# Patient Record
Sex: Male | Born: 1985 | Race: Black or African American | Hispanic: No | Marital: Married | Smoking: Never smoker
Health system: Southern US, Community
[De-identification: ages and names within clinical notes are randomized; demographics above are authoritative.]

---

## 2016-09-05 DIAGNOSIS — J40 Bronchitis, not specified as acute or chronic: Secondary | ICD-10-CM | POA: Diagnosis not present

## 2017-02-07 DIAGNOSIS — Z0000 Encounter for general adult medical examination without abnormal findings: Secondary | ICD-10-CM | POA: Diagnosis not present

## 2017-02-07 DIAGNOSIS — Z1389 Encounter for screening for other disorder: Secondary | ICD-10-CM | POA: Diagnosis not present

## 2017-02-08 DIAGNOSIS — K7689 Other specified diseases of liver: Secondary | ICD-10-CM | POA: Diagnosis not present

## 2017-02-08 DIAGNOSIS — R7309 Other abnormal glucose: Secondary | ICD-10-CM | POA: Diagnosis not present

## 2018-01-30 ENCOUNTER — Ambulatory Visit: Payer: Self-pay

## 2018-01-30 ENCOUNTER — Other Ambulatory Visit: Payer: Self-pay | Admitting: Occupational Medicine

## 2018-01-30 DIAGNOSIS — Z0000 Encounter for general adult medical examination without abnormal findings: Secondary | ICD-10-CM

## 2018-03-15 DIAGNOSIS — E6609 Other obesity due to excess calories: Secondary | ICD-10-CM | POA: Diagnosis not present

## 2018-03-15 DIAGNOSIS — R7309 Other abnormal glucose: Secondary | ICD-10-CM | POA: Diagnosis not present

## 2018-03-15 DIAGNOSIS — Z0000 Encounter for general adult medical examination without abnormal findings: Secondary | ICD-10-CM | POA: Diagnosis not present

## 2018-10-11 ENCOUNTER — Other Ambulatory Visit (HOSPITAL_COMMUNITY): Payer: Self-pay | Admitting: Family Medicine

## 2018-10-11 DIAGNOSIS — S8992XD Unspecified injury of left lower leg, subsequent encounter: Secondary | ICD-10-CM

## 2018-10-15 ENCOUNTER — Other Ambulatory Visit: Payer: Self-pay | Admitting: Family Medicine

## 2018-10-15 ENCOUNTER — Other Ambulatory Visit (HOSPITAL_COMMUNITY): Payer: Self-pay | Admitting: Family Medicine

## 2018-10-15 DIAGNOSIS — M25562 Pain in left knee: Secondary | ICD-10-CM

## 2018-10-30 ENCOUNTER — Ambulatory Visit (HOSPITAL_COMMUNITY): Payer: 59

## 2018-11-16 ENCOUNTER — Encounter (HOSPITAL_COMMUNITY): Payer: Self-pay

## 2018-11-16 ENCOUNTER — Ambulatory Visit (HOSPITAL_COMMUNITY): Admission: RE | Admit: 2018-11-16 | Payer: 59 | Source: Ambulatory Visit

## 2018-11-22 ENCOUNTER — Ambulatory Visit (HOSPITAL_COMMUNITY): Payer: 59

## 2018-12-06 ENCOUNTER — Ambulatory Visit (HOSPITAL_COMMUNITY): Payer: 59

## 2018-12-20 ENCOUNTER — Ambulatory Visit (HOSPITAL_COMMUNITY): Payer: 59

## 2019-02-19 ENCOUNTER — Other Ambulatory Visit: Payer: Self-pay | Admitting: *Deleted

## 2019-02-19 DIAGNOSIS — Z20822 Contact with and (suspected) exposure to covid-19: Secondary | ICD-10-CM

## 2019-02-21 LAB — NOVEL CORONAVIRUS, NAA: SARS-CoV-2, NAA: NOT DETECTED

## 2019-07-17 ENCOUNTER — Ambulatory Visit: Payer: 59 | Admitting: Allergy & Immunology

## 2019-08-16 ENCOUNTER — Other Ambulatory Visit: Payer: Self-pay

## 2019-08-16 ENCOUNTER — Ambulatory Visit (INDEPENDENT_AMBULATORY_CARE_PROVIDER_SITE_OTHER): Payer: No Typology Code available for payment source | Admitting: Allergy & Immunology

## 2019-08-16 ENCOUNTER — Encounter: Payer: Self-pay | Admitting: Allergy & Immunology

## 2019-08-16 VITALS — BP 112/82 | HR 88 | Temp 98.6°F | Resp 18 | Ht 77.0 in | Wt 263.4 lb

## 2019-08-16 DIAGNOSIS — T7800XD Anaphylactic reaction due to unspecified food, subsequent encounter: Secondary | ICD-10-CM | POA: Diagnosis not present

## 2019-08-16 DIAGNOSIS — J3089 Other allergic rhinitis: Secondary | ICD-10-CM

## 2019-08-16 NOTE — Progress Notes (Signed)
NEW PATIENT  Date of Service/Encounter:  08/16/19  Referring provider: [facility]   Assessment:   Perennial allergic rhinitis (dust mite and cockroach)  Anaphylactic shock due to food  Plan/Recommendations:   1. Chronic rhinitis - Testing was positive only to dust mite and cockroach. - We are going to get blood work to confirm this. - Copy of testing provided. - Continue with Flonase one spray per nostril up to twice daily as needed. - Continue with Allegra or Zyrtec daily. - We will call you in 1-2 weeks with the results of the testing.   2. Anaphylactic shock due to food (shellfish) - Testing was negative to shellfish, but we are going to get some blood work to look into this more.  - We will call you in 1-2 weeks with the results of the testing. - We will hold off on EpiPen training at this time.  3. Return in about 3 months (around 11/16/2019). This can be an in-person, a virtual Webex or a telephone follow up visit.  Subjective:   Daryl Decker is a 34 y.o. male presenting today for evaluation of  Chief Complaint  Patient presents with  . Food Intolerance    Shellfish- throat itching, hives on lips, chicken  . Allergic Rhinitis   . Sinus Problem    hx sinus problem    Daryl Decker has a history of the following: Patient Active Problem List   Diagnosis Date Noted  . Perennial allergic rhinitis 08/18/2019  . Anaphylactic shock due to adverse food reaction 08/18/2019    History obtained from: chart review and patient.  Daryl Decker was referred by [facility], [facility].     Daryl Decker is a 34 y.o. male presenting for an evaluation of seafood and environmental allergies. He lives in [place] but sees the [facility] [place] for his medical needs.    Allergic Rhinitis Symptom History: He went out to mow the grass, he develops redness and headaches. This has been ongoing since he was a child. He started having bigger issues when he was 21.  He is currently taking cetirizine daily.  Food Allergy Symptom History: He hs noticed that he has throat closure and irritation with bumps around his lips/face from eating shellfish. He is unsure whether this was from the mask or something else. He started having issues in the last year or so. He has noticed some itchiness in his throat with ingestion of shellfish. Things cleared up when he was not wearing his mask. He was at the beach and ate some seafood and developed those symptoms. He never had to go to the hospital for these symptoms. He did take Benadryl and his symptoms got better.   Otherwise, there is no history of other atopic diseases, including asthma, drug allergies, stinging insect allergies, eczema, urticaria or contact dermatitis. There is no significant infectious history. Vaccinations are up to date.    Past Medical History: Patient Active Problem List   Diagnosis Date Noted  . Perennial allergic rhinitis 08/18/2019  . Anaphylactic shock due to adverse food reaction 08/18/2019    Medication List:  Allergies as of 08/16/2019   No Known Allergies     Medication List       Accurate as of Aug 16, 2019 11:59 PM. If you have any questions, ask your nurse or doctor.        cetirizine 10 MG tablet Commonly known as: ZYRTEC Take 10 mg by mouth daily as needed for allergies.  fexofenadine 180 MG tablet Commonly known as: ALLEGRA Take 180 mg by mouth daily as needed for allergies or rhinitis.   levocetirizine 5 MG tablet Commonly known as: XYZAL Take 5 mg by mouth daily as needed for allergies.       Birth History: non-contributory  Developmental History: non-contributory  Past Surgical History: History reviewed. No pertinent surgical history.   Family History: History reviewed. No pertinent family history.   Social History: Daryl Decker lives at home with his wife and four children (the latest one is not even one week old).  He lives in a house that was built in  1971.  There are hardwoods and tile throughout the home.  They have gas heating and central cooling.  There are no animals inside or outside of the home.  He does have dust mite covers on his bedding.  There is no tobacco exposure.  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  He was in the [employer] for 6 years.   Review of Systems  Constitutional: Negative.  Negative for chills, fever, malaise/fatigue and weight loss.  HENT: Positive for congestion and sinus pain. Negative for ear discharge and ear pain.        Positive for postnasal drip. Positive for throat clearing.  Eyes: Negative for pain, discharge and redness.  Respiratory: Negative for cough, sputum production, shortness of breath and wheezing.   Cardiovascular: Negative.  Negative for chest pain and palpitations.  Gastrointestinal: Negative for abdominal pain, constipation, diarrhea, heartburn, nausea and vomiting.  Skin: Negative.  Negative for itching and rash.  Neurological: Negative for dizziness and headaches.  Endo/Heme/Allergies: Positive for environmental allergies. Does not bruise/bleed easily.       Objective:   Blood pressure 112/82, pulse 88, temperature 98.6 F (37 C), temperature source Temporal, resp. rate 18, height 6\' 5"  (1.956 m), weight 263 lb 6.4 oz (119.5 kg), SpO2 96 %. Body mass index is 31.23 kg/m.   Physical Exam:   Physical Exam  Constitutional: He appears well-developed.  Fit male. Friendly.   HENT:  Head: Normocephalic and atraumatic.  Right Ear: Tympanic membrane, external ear and ear canal normal. No drainage, swelling or tenderness. Tympanic membrane is not injected, not scarred, not erythematous, not retracted and not bulging.  Left Ear: Tympanic membrane, external ear and ear canal normal. No drainage, swelling or tenderness. Tympanic membrane is not injected, not scarred, not erythematous, not retracted and not bulging.  Nose: Mucosal edema and rhinorrhea  present. No nasal deformity or septal deviation. No epistaxis. Right sinus exhibits no maxillary sinus tenderness and no frontal sinus tenderness. Left sinus exhibits no maxillary sinus tenderness and no frontal sinus tenderness.  Mouth/Throat: Uvula is midline and oropharynx is clear and moist. Mucous membranes are not pale and not dry.  Turbinates enlarged and pale.   Eyes: Pupils are equal, round, and reactive to light. Conjunctivae and EOM are normal. Right eye exhibits no chemosis and no discharge. Left eye exhibits no chemosis and no discharge. Right conjunctiva is not injected. Left conjunctiva is not injected.  Cardiovascular: Normal rate, regular rhythm and normal heart sounds.  Respiratory: Effort normal and breath sounds normal. No accessory muscle usage. No tachypnea. No respiratory distress. He has no wheezes. He has no rhonchi. He has no rales. He exhibits no tenderness.  No increased work of breathing noted.   GI: There is no abdominal tenderness. There is no rebound and no guarding.  Lymphadenopathy:  Head (right side): No submandibular, no tonsillar and no occipital adenopathy present.       Head (left side): No submandibular, no tonsillar and no occipital adenopathy present.    He has no cervical adenopathy.  Neurological: He is alert.  Skin: No abrasion, no petechiae and no rash noted. Rash is not papular, not vesicular and not urticarial. No erythema. No pallor.  No eczematous or urticarial lesions noted.   Psychiatric: He has a normal mood and affect.     Diagnostic studies:    Allergy Studies:    Airborne Adult Perc - 08/16/19 1500    Time Antigen Placed  0330    Allergen Manufacturer  Waynette Buttery    Location  Back    Number of Test  59    1. Control-Buffer 50% Glycerol  Negative    2. Control-Histamine 1 mg/ml  2+    3. Albumin saline  Negative    4. Bahia  Negative    5. French Southern Territ[place]  Negative    6. Johnson  Negative    7. Kentucky Blue  Negative    8. Meadow  Fescue  Negative    9. Perennial Rye  Negative    10. Sweet Vernal  Negative    11. Timothy  Negative    12. Cocklebur  Negative    13. Burweed Marshelder  Negative    14. Ragweed, short  Negative    15. Ragweed, Giant  Negative    16. Plantain,  English  Negative    17. Lamb's Quarters  Negative    18. Sheep Sorrell  Negative    19. Rough Pigweed  Negative    20. Marsh Elder, Rough  Negative    21. Mugwort, Common  Negative    22. Ash mix  Negative    23. Birch mix  Negative    24. Beech American  Negative    25. Box, Elder  Negative    26. Cedar, red  Negative    27. Cottonwood, [place]  Negative    28. Elm mix  Negative    29. Hickory  Negative    30. Maple mix  Negative    31. Oak, [place] mix  Negative    32. Pecan Pollen  Negative    33. Pine mix  Negative    34. Sycamore Eastern  Negative    35. Walnut, Black Pollen  Negative    36. Alternaria alternata  Negative    37. Cladosporium Herbarum  Negative    38. Aspergillus mix  Negative    39. Penicillium mix  Negative    40. Bipolaris sorokiniana (Helminthosporium)  Negative    41. Drechslera spicifera (Curvularia)  Negative    42. Mucor plumbeus  Negative    43. Fusarium moniliforme  Negative    44. Aureobasidium pullulans (pullulara)  Negative    45. Rhizopus oryzae  Negative    46. Botrytis cinera  Negative    47. Epicoccum nigrum  Negative    48. Phoma betae  Negative    49. Candida Albicans  Negative    50. Trichophyton mentagrophytes  Negative    51. Mite, D Farinae  5,000 AU/ml  2+    52. Mite, D Pteronyssinus  5,000 AU/ml  3+    53. Cat Hair 10,000 BAU/ml  Negative    54.  Dog Epithelia  Negative    55. Mixed Feathers  Negative    56. Horse Epithelia  Negative    57. Cockroach,  German  3+    58. Mouse  Negative    59. Tobacco Leaf  Negative     Food Adult Perc - 08/16/19 1500    Time Antigen Placed  0330    Allergen Manufacturer  Lavella Hammock    Location  Back    Number of allergen test  12    18.  Catfish  Negative    19. Bass  Negative    20. Trout  Negative    21. Tuna  Negative    22. Salmon  Negative    23. Flounder  Negative    24. Codfish  Negative    25. Shrimp  Negative    26. Crab  Negative    27. Lobster  Negative    28. Oyster  Negative    29. Scallops  Negative       Allergy testing results were read and interpreted by myself, documented by clinical staff.         Salvatore Marvel, MD [facility]

## 2019-08-16 NOTE — Patient Instructions (Addendum)
1. Chronic rhinitis - Testing was positive only to dust mite and cockroach. - We are going to get blood work to confirm this. - Copy of testing provided. - Continue with Flonase one spray per nostril up to twice daily as needed. - Continue with Allegra or Zyrtec daily. - We will call you in 1-2 weeks with the results of the testing.   2. Anaphylactic shock due to food (shellfish) - Testing was negative to shellfish, but we are going to get some blood work to look into this more.  - We will call you in 1-2 weeks with the results of the testing. - We will hold off on EpiPen training at this time.  3. Return in about 3 months (around 11/16/2019). This can be an in-person, a virtual Webex or a telephone follow up visit.   Please inform us of any Emergency Department visits, hospitalizations, or changes in symptoms. Call us before going to the ED for breathing or allergy symptoms since we might be able to fit you in for a sick visit. Feel free to contact us anytime with any questions, problems, or concerns.  It was a pleasure to meet you today! Congrats on the newest kiddo!   Websites that have reliable patient information: 1. American Academy of Asthma, Allergy, and Immunology: www.aaaai.org 2. Food Allergy Research and Education (FARE): foodallergy.org 3. Mothers of Asthmatics: http://www.asthmacommunitynetwork.org 4. American College of Allergy, Asthma, and Immunology: www.acaai.org   COVID-19 Vaccine Information can be found at: [email or web address].nl For questions related to vaccine distribution or appointments, please email [email or web address] or call [phone].     "Like" [place] on Facebook and Instagram for our latest updates!       HAPPY SPRING!  Make sure you are registered to vote! If you have moved or changed any of your contact information, you will need to get this updated before voting!  In some cases, you MAY  be able to register to vote online: [email or web address]    Control of Dust Mite Allergen    Dust mites play a major role in allergic asthma and rhinitis.  They occur in environments with high humidity wherever human skin is found.  Dust mites absorb humidity from the atmosphere (ie, they do not drink) and feed on organic matter (including shed human and animal skin).  Dust mites are a microscopic type of insect that you cannot see with the naked eye.  High levels of dust mites have been detected from mattresses, pillows, carpets, upholstered furniture, bed covers, clothes, soft toys and any woven material.  The principal allergen of the dust mite is found in its feces.  A gram of dust may contain 1,000 mites and 250,000 fecal particles.  Mite antigen is easily measured in the air during house cleaning activities.  Dust mites do not bite and do not cause harm to humans, other than by triggering allergies/asthma.    Ways to decrease your exposure to dust mites in your home:  1. Encase mattresses, box springs and pillows with a mite-impermeable barrier or cover   2. Wash sheets, blankets and drapes weekly in hot water (130 F) with detergent and dry them in a dryer on the hot setting.  3. Have the room cleaned frequently with a vacuum cleaner and a damp dust-mop.  For carpeting or rugs, vacuuming with a vacuum cleaner equipped with a high-efficiency particulate air (HEPA) filter.  The dust mite allergic individual should not be in a room which is being cleaned and should wait 1  hour after cleaning before going into the room. 4. Do not sleep on upholstered furniture (eg, couches).   5. If possible removing carpeting, upholstered furniture and drapery from the home is ideal.  Horizontal blinds should be eliminated in the rooms where the person spends the most time (bedroom, study, television room).  Washable vinyl, roller-type shades are optimal. 6. Remove all non-washable  stuffed toys from the bedroom.  Wash stuffed toys weekly like sheets and blankets above.   7. Reduce indoor humidity to less than 50%.  Inexpensive humidity monitors can be purchased at most hardware stores.  Do not use a humidifier as can make the problem worse and are not recommended.    Control of Cockroach Allergen  Cockroach allergen has been identified as an important cause of acute attacks of asthma, especially in urban settings.  There are fifty-five species of cockroach that exist in the [place], however only three, the [place], [place] species produce allergen that can affect patients with Asthma.  Allergens can be obtained from fecal particles, egg casings and secretions from cockroaches.    1. Remove food sources. 2. Reduce access to water. 3. Seal access and entry points. 4. Spray runways with 0.5-1% Diazinon or Chlorpyrifos 5. Blow boric acid power under stoves and refrigerator. 6. Place bait stations (hydramethylnon) at feeding sites.

## 2019-08-18 ENCOUNTER — Encounter: Payer: Self-pay | Admitting: Allergy & Immunology

## 2019-08-18 DIAGNOSIS — J3089 Other allergic rhinitis: Secondary | ICD-10-CM | POA: Insufficient documentation

## 2019-08-18 DIAGNOSIS — T7800XA Anaphylactic reaction due to unspecified food, initial encounter: Secondary | ICD-10-CM | POA: Insufficient documentation

## 2019-11-13 ENCOUNTER — Encounter: Payer: Self-pay | Admitting: Allergy & Immunology

## 2019-11-13 ENCOUNTER — Other Ambulatory Visit: Payer: Self-pay

## 2019-11-13 ENCOUNTER — Ambulatory Visit (INDEPENDENT_AMBULATORY_CARE_PROVIDER_SITE_OTHER): Payer: No Typology Code available for payment source | Admitting: Allergy & Immunology

## 2019-11-13 VITALS — BP 108/70 | HR 86 | Temp 98.9°F | Resp 18

## 2019-11-13 DIAGNOSIS — T7800XD Anaphylactic reaction due to unspecified food, subsequent encounter: Secondary | ICD-10-CM

## 2019-11-13 DIAGNOSIS — J3089 Other allergic rhinitis: Secondary | ICD-10-CM

## 2019-11-13 NOTE — Progress Notes (Signed)
FOLLOW UP  Date of Service/Encounter:  11/14/19   Assessment:   Perennial allergic rhinitis (dust mite and cockroach)  Anaphylactic shock due to food - needs blood work for confirmation  Plan/Recommendations:   1. Chronic rhinitis (dust mites, cockroach) - Continue with Flonase one spray per nostril up to twice daily as needed. - Continue with Allegra or Zyrtec daily.  2. Anaphylactic shock due to food (shellfish) - Blood work pending.  3. Return in about 1 year (around 11/12/2020). This can be an in-person, a virtual Webex or a telephone follow up visit.   Subjective:   Daryl Decker is a 34 y.o. male presenting today for follow up of  Chief Complaint  Patient presents with  . Allergic Rhinitis     has changed up his lifestyle some after finding out his allergies. has started using a respirator mask, wearing long sleeves, etc. has helped tremendously. headaches are much less frequent.     Daryl Decker has a history of the following: Patient Active Problem List   Diagnosis Date Noted  . Perennial allergic rhinitis 08/18/2019  . Anaphylactic shock due to adverse food reaction 08/18/2019    History obtained from: chart review and patient.  Daryl Decker is a 34 y.o. male presenting for a follow up visit.  He was last seen in May 2021.  At that time, he had testing that was positive only to dust mite and cockroach.  We continued the Flonase and Allegra.  We also got blood work to confirm this.  He did have testing that was negative to all of her shellfish.  We got some blood work to confirm this, but does not seem that this was ever drawn.  We held off on an EpiPen.  Since last visit, he reports he has done very well.  By knowing what his allergic triggers are, he can avoid them a lot better.  He remains on the Flonase and Allegra.  He has not gotten his blood work yet.  He has not needed any antibiotics since last visit.  He continues to avoid shellfish.  He completely forgot  about the blood work pending so he is planning to go to get it today after the visit.  He has not had any accidental exposures.  He did have a vasectomy 3 months ago and is reporting some residual pain from the procedure.  He is using only ibuprofen for that.  He is going to talk to his urologist today.  Otherwise, there have been no changes to his past medical history, surgical history, family history, or social history.    Review of Systems  Constitutional: Negative.  Negative for chills, fever, malaise/fatigue and weight loss.  HENT: Positive for congestion. Negative for ear discharge, ear pain and sinus pain.   Eyes: Negative for pain, discharge and redness.  Respiratory: Negative for cough, sputum production, shortness of breath and wheezing.   Cardiovascular: Negative.  Negative for chest pain and palpitations.  Gastrointestinal: Negative for abdominal pain, constipation, diarrhea, heartburn, nausea and vomiting.  Skin: Negative.  Negative for itching and rash.  Neurological: Negative for dizziness and headaches.  Endo/Heme/Allergies: Positive for environmental allergies. Does not bruise/bleed easily.       Objective:   Blood pressure 108/70, pulse 86, temperature 98.9 F (37.2 C), temperature source Temporal, resp. rate 18, SpO2 98 %. There is no height or weight on file to calculate BMI.   Physical Exam:  Physical Exam Constitutional:      Appearance: He  is well-developed.     Comments: Pleasant interactive male.  HENT:     Head: Normocephalic and atraumatic.     Right Ear: Tympanic membrane, ear canal and external ear normal.     Left Ear: Tympanic membrane, ear canal and external ear normal.     Nose: No nasal deformity, septal deviation, mucosal edema or rhinorrhea.     Right Turbinates: Enlarged and swollen.     Left Turbinates: Enlarged and swollen.     Right Sinus: No maxillary sinus tenderness or frontal sinus tenderness.     Left Sinus: No maxillary sinus  tenderness or frontal sinus tenderness.     Mouth/Throat:     Mouth: Mucous membranes are not pale and not dry.     Pharynx: Uvula midline.  Eyes:     General:        Right eye: No discharge.        Left eye: No discharge.     Conjunctiva/sclera: Conjunctivae normal.     Right eye: Right conjunctiva is not injected. No chemosis.    Left eye: Left conjunctiva is not injected. No chemosis.    Pupils: Pupils are equal, round, and reactive to light.  Cardiovascular:     Rate and Rhythm: Normal rate and regular rhythm.     Heart sounds: Normal heart sounds.  Pulmonary:     Effort: Pulmonary effort is normal. No tachypnea, accessory muscle usage or respiratory distress.     Breath sounds: Normal breath sounds. No wheezing, rhonchi or rales.     Comments: Moving air well in all lung fields.  No increased work of breathing. Chest:     Chest wall: No tenderness.  Lymphadenopathy:     Cervical: No cervical adenopathy.  Skin:    Coloration: Skin is not pale.     Findings: No abrasion, erythema, petechiae or rash. Rash is not papular, urticarial or vesicular.     Comments: No eczematous or urticarial lesions noted.  Neurological:     Mental Status: He is alert.  Psychiatric:        Behavior: Behavior is cooperative.      Diagnostic studies: none      Malachi Bonds, MD  [facility]

## 2019-11-13 NOTE — Patient Instructions (Addendum)
1. Chronic rhinitis (dust mites, cockroach) - Continue with Flonase one spray per nostril up to twice daily as needed. - Continue with Allegra or Zyrtec daily.  2. Anaphylactic shock due to food (shellfish) - Blood work pending.  3. Return in about 1 year (around 11/12/2020). This can be an in-person, a virtual Webex or a telephone follow up visit.   Please inform us of any Emergency Department visits, hospitalizations, or changes in symptoms. Call us before going to the ED for breathing or allergy symptoms since we might be able to fit you in for a sick visit. Feel free to contact us anytime with any questions, problems, or concerns.  It was a pleasure to see you again today!  Websites that have reliable patient information: 1. American Academy of Asthma, Allergy, and Immunology: www.aaaai.org 2. Food Allergy Research and Education (FARE): foodallergy.org 3. Mothers of Asthmatics: http://www.asthmacommunitynetwork.org 4. American College of Allergy, Asthma, and Immunology: www.acaai.org   COVID-19 Vaccine Information can be found at: PodExchange.nl For questions related to vaccine distribution or appointments, please email [email or web address] or call [phone].     "Like" [place] on Facebook and Instagram for our latest updates!        Make sure you are registered to vote! If you have moved or changed any of your contact information, you will need to get this updated before voting!  In some cases, you MAY be able to register to vote online: [email or web address]

## 2019-11-14 ENCOUNTER — Encounter: Payer: Self-pay | Admitting: Allergy & Immunology

## 2019-11-19 ENCOUNTER — Other Ambulatory Visit: Payer: Self-pay

## 2019-11-19 ENCOUNTER — Other Ambulatory Visit: Payer: Self-pay | Admitting: *Deleted

## 2019-11-19 ENCOUNTER — Other Ambulatory Visit: Payer: 59

## 2019-11-19 DIAGNOSIS — Z20822 Contact with and (suspected) exposure to covid-19: Secondary | ICD-10-CM

## 2019-11-20 LAB — NOVEL CORONAVIRUS, NAA: SARS-CoV-2, NAA: NOT DETECTED

## 2020-03-21 HISTORY — PX: OTHER SURGICAL HISTORY: SHX169

## 2020-08-07 ENCOUNTER — Telehealth: Payer: Self-pay | Admitting: Allergy & Immunology

## 2020-08-07 NOTE — Telephone Encounter (Signed)
New request for authorization faxed to [facility] [phone].

## 2020-08-10 NOTE — Patient Instructions (Addendum)
1. Chronic rhinitis (dust mites, cockroach) - Continue with Flonase one spray per nostril up to twice daily as needed. - Continue with Allegra or Zyrtec daily as needed for runny nose or itching.  2. Anaphylactic shock due to food (shellfish) - Avoid fish and shellfish. In case of an allergic reaction, give Benadryl 4 teaspoonfuls every 4 hours, and if life-threatening symptoms occur, inject with EpiPen 0.3 mg. Demonstration given along with emergency action plan We will get some lab work to follow up on these allergies. We will call you with results once they are back  3.  Allergic conjunctivitis Start Pataday 0.2 (olopatadine) 1 drop each eye once a day as needed for itchy watery eyes  Please let us know if this treatment plan is not working well for you Schedule a follow up appointment in 1 year or sooner if needed

## 2020-08-12 ENCOUNTER — Ambulatory Visit (INDEPENDENT_AMBULATORY_CARE_PROVIDER_SITE_OTHER): Payer: No Typology Code available for payment source | Admitting: Family

## 2020-08-12 ENCOUNTER — Other Ambulatory Visit: Payer: Self-pay

## 2020-08-12 ENCOUNTER — Encounter: Payer: Self-pay | Admitting: Family

## 2020-08-12 VITALS — BP 118/70 | HR 78 | Temp 98.3°F | Resp 18 | Ht 77.0 in | Wt 253.8 lb

## 2020-08-12 DIAGNOSIS — H1013 Acute atopic conjunctivitis, bilateral: Secondary | ICD-10-CM

## 2020-08-12 DIAGNOSIS — J3089 Other allergic rhinitis: Secondary | ICD-10-CM

## 2020-08-12 DIAGNOSIS — T7800XD Anaphylactic reaction due to unspecified food, subsequent encounter: Secondary | ICD-10-CM

## 2020-08-12 MED ORDER — OLOPATADINE HCL 0.2 % OP SOLN: OPHTHALMIC | 3 refills | Status: AC

## 2020-08-12 MED ORDER — EPINEPHRINE 0.3 MG/0.3ML IJ SOAJ
0.3000 mg | INTRAMUSCULAR | 1 refills | Status: DC | PRN
Start: 2020-08-12 — End: 2021-11-19

## 2020-08-12 NOTE — Progress Notes (Signed)
[address] [place] [address] Dept: [phone]  FOLLOW UP NOTE  Patient ID: Daryl Decker, male    DOB: 05-Feb-1986  Age: 35 y.o. MRN: [id number] Date of Office Visit: 08/12/2020  Assessment  Chief Complaint: perennial allergic rhinitis  HPI Daryl Decker is a 35 year old male who presents today for follow-up of chronic rhinitis and anaphylactic shock due to food.  He was last seen on November 14, 2019 by Dr. Dellis Anes.  Allergic rhinitis is reported as controlled with Flonase nasal spray as needed and Zyrtec as needed.  He reports since getting hypoallergenic sheets and wearing a mask at work that his symptoms have decreased and his headaches have cut down now.  He now will maybe have a headache once every 2 months and these are more migraine headaches.  He denies any rhinorrhea, nasal congestion, and postnasal drip.  He has not had any sinus infections since we last saw him.  He reports that at times he will try a little bit of shellfish and fish and will begin to have an itchy/ irritated throat and if he eats too much of these items his stomach will hurt and he will feel out of it.  On May 8 of this year he was in [facility] and had shrimp and crab legs and had these same symptoms.  His wife suggested that he take Benadryl and within 30 minutes he started feeling better.  He reports that he does not have an EpiPen.  He is interested in getting lab work that he did not get done from the last office visit.   Drug Allergies:  No Known Allergies  Review of Systems: Review of Systems  Constitutional: Negative for chills and fever.  HENT:       Denies rhinorrhea, nasal congestion, and postnasal drip  Eyes:       Reports occasional itchy watery eyes  Respiratory: Negative for cough, shortness of breath and wheezing.   Cardiovascular: Negative for chest pain and palpitations.  Gastrointestinal: Negative for heartburn.  Genitourinary: Negative for dysuria.  Skin:  Positive for itching.       He reports that he recently had poison ivy on his arms and the back of his neck  Neurological: Positive for headaches.       Reports occasional migraines  Endo/Heme/Allergies: Positive for environmental allergies.    Physical Exam: BP 118/70 (BP Location: Right Arm, Patient Position: Sitting, Cuff Size: Large)   Pulse 78   Temp 98.3 F (36.8 C) (Temporal)   Resp 18   Ht 6\' 5"  (1.956 m)   Wt 253 lb 12.8 oz (115.1 kg)   SpO2 97%   BMI 30.10 kg/m    Physical Exam Constitutional:      Appearance: Normal appearance.  HENT:     Head: Normocephalic and atraumatic.     Comments: Pharynx normal, eyes normal, ears normal, nose normal    Right Ear: Tympanic membrane, ear canal and external ear normal.     Left Ear: Tympanic membrane, ear canal and external ear normal.     Nose: Nose normal.     Mouth/Throat:     Mouth: Mucous membranes are moist.     Pharynx: Oropharynx is clear.  Eyes:     Conjunctiva/sclera: Conjunctivae normal.  Cardiovascular:     Rate and Rhythm: Regular rhythm.     Heart sounds: Normal heart sounds.  Pulmonary:     Effort: Pulmonary effort is normal.     Breath sounds:  Normal breath sounds.     Comments: Lungs clear to auscultation Musculoskeletal:     Cervical back: Neck supple.  Skin:    General: Skin is warm.     Comments: No rash or urticarial lesions noted  Neurological:     Mental Status: He is alert and oriented to person, place, and time.  Psychiatric:        Mood and Affect: Mood normal.        Behavior: Behavior normal.        Thought Content: Thought content normal.        Judgment: Judgment normal.     Diagnostics: None  Assessment and Plan: 1. Anaphylactic shock due to food, subsequent encounter   2. Perennial allergic rhinitis   3. Allergic conjunctivitis of both eyes     Meds ordered this encounter  Medications  . EPINEPHrine 0.3 mg/0.3 mL IJ SOAJ injection    Sig: Inject 0.3 mg into the muscle  as needed for anaphylaxis.    Dispense:  2 each    Refill:  1  . Olopatadine HCl 0.2 % SOLN    Sig: Place 1 drop in each eye once a day as needed for itchy watery eyes    Dispense:  2.5 mL    Refill:  3    Patient Instructions  1. Chronic rhinitis (dust mites, cockroach) - Continue with Flonase one spray per nostril up to twice daily as needed. - Continue with Allegra or Zyrtec daily as needed for runny nose or itching.  2. Anaphylactic shock due to food (shellfish) - Avoid fish and shellfish. In case of an allergic reaction, give Benadryl 4 teaspoonfuls every 4 hours, and if life-threatening symptoms occur, inject with EpiPen 0.3 mg. Demonstration given along with emergency action plan We will get some lab work to follow up on these allergies. We will call you with results once they are back  3.  Allergic conjunctivitis Start Pataday 0.2 (olopatadine) 1 drop each eye once a day as needed for itchy watery eyes  Please let us know if this treatment plan is not working well for you Schedule a follow up appointment in 1 year or sooner if needed             Return in about 1 year (around 08/12/2021), or if symptoms worsen or fail to improve.    Thank you for the opportunity to care for this patient.  Please do not hesitate to contact me with questions.  Nehemiah Settle, FNP [facility]

## 2020-10-02 NOTE — Telephone Encounter (Signed)
Refaxed request for service to [facility].

## 2021-06-17 ENCOUNTER — Ambulatory Visit: Payer: Self-pay

## 2021-06-17 ENCOUNTER — Other Ambulatory Visit: Payer: Self-pay | Admitting: Family Medicine

## 2021-06-17 DIAGNOSIS — Z0000 Encounter for general adult medical examination without abnormal findings: Secondary | ICD-10-CM

## 2021-11-05 ENCOUNTER — Ambulatory Visit: Payer: No Typology Code available for payment source | Admitting: Allergy & Immunology

## 2021-11-19 ENCOUNTER — Ambulatory Visit (INDEPENDENT_AMBULATORY_CARE_PROVIDER_SITE_OTHER): Payer: No Typology Code available for payment source | Admitting: Family Medicine

## 2021-11-19 ENCOUNTER — Encounter: Payer: Self-pay | Admitting: Family Medicine

## 2021-11-19 VITALS — BP 110/82 | HR 90 | Temp 98.9°F | Resp 18 | Ht 77.0 in | Wt 267.4 lb

## 2021-11-19 DIAGNOSIS — H1013 Acute atopic conjunctivitis, bilateral: Secondary | ICD-10-CM

## 2021-11-19 DIAGNOSIS — J3089 Other allergic rhinitis: Secondary | ICD-10-CM | POA: Diagnosis not present

## 2021-11-19 DIAGNOSIS — T7800XD Anaphylactic reaction due to unspecified food, subsequent encounter: Secondary | ICD-10-CM

## 2021-11-19 DIAGNOSIS — T7800XA Anaphylactic reaction due to unspecified food, initial encounter: Secondary | ICD-10-CM

## 2021-11-19 MED ORDER — EPINEPHRINE 0.3 MG/0.3ML IJ SOAJ: 0.3000 mg | INTRAMUSCULAR | 1 refills | Status: AC | PRN

## 2021-11-19 NOTE — Progress Notes (Signed)
[address] [place] [address] Dept: [phone]  FOLLOW UP NOTE  Patient ID: Daryl Decker, male    DOB: 1986/02/26  Age: 36 y.o. MRN: [id number] Date of Office Visit: 11/19/2021  Assessment  Chief Complaint: Allergy Testing (Seafood)  HPI Daryl Decker is a 36 year old male who presents to the clinic for follow-up visit.  He was last seen in this clinic on 08/12/2021 by Nehemiah Settle, FNP, for evaluation of allergic rhinitis and food allergy to fish and shellfish.  Allergic rhinitis is reported as moderately well controlled with symptoms including sneezing especially when seasons change.  He continues Claritin as needed and Flonase as needed.  He is not currently using a nasal saline rinse.  He does report headache occurring about once a week.  He reports these headaches as a combination of sinus and migraine headache.  He continues taking sumatriptan as needed and Tylenol as needed with relief of symptoms.  He has not had any antihistamines over the last several days in anticipation of allergy skin testing.  His last environmental allergy skin testing was on 08/06/2019 and was positive to dust mite and cockroach.  Allergic conjunctivitis is reported as moderately well controlled with occasional allergy eyedrops with relief of symptoms.  He reports that he ate shellfish at a seafood restaurant about 1 year ago after which he experienced throat itching and a knot in his stomach.  He does report that he occasionally eats shrimp after which he experiences throat itching.  He reports taking Benadryl with relief of symptoms.  He denies throat swelling, hives, or abdominal symptoms.  His last food allergy skin testing was on 08/06/2019 and was negative to fish and shellfish panels.  Lab work was ordered at that time, however, has not been collected.  His current medications are listed in the chart.  Drug Allergies:  No Known Allergies  Physical Exam: BP 110/82   Pulse 90   Temp  98.9 F (37.2 C)   Resp 18   Ht 6\' 5"  (1.956 m)   Wt 267 lb 6 oz (121.3 kg)   SpO2 96%   BMI 31.71 kg/m    Physical Exam Vitals reviewed.  Constitutional:      Appearance: Normal appearance.  HENT:     Head: Normocephalic and atraumatic.     Right Ear: Tympanic membrane normal.     Left Ear: Tympanic membrane normal.     Nose:     Comments: Bilateral nares edematous and pale with clear nasal drainage noted.  Pharynx normal.  Ears normal.  Eyes normal.    Mouth/Throat:     Pharynx: Oropharynx is clear.  Eyes:     Conjunctiva/sclera: Conjunctivae normal.  Cardiovascular:     Rate and Rhythm: Normal rate and regular rhythm.     Heart sounds: Normal heart sounds. No murmur heard. Pulmonary:     Effort: Pulmonary effort is normal.     Breath sounds: Normal breath sounds.     Comments: Lungs clear to auscultation Musculoskeletal:        General: Normal range of motion.     Cervical back: Normal range of motion and neck supple.  Skin:    General: Skin is warm and dry.  Neurological:     Mental Status: He is alert and oriented to person, place, and time.  Psychiatric:        Mood and Affect: Mood normal.        Behavior: Behavior normal.  Thought Content: Thought content normal.        Judgment: Judgment normal.     Diagnostics: Percutaneous selected foods were negative to shellfish mix and shellfish panel with adequate controls.   Assessment and Plan: 1. Anaphylactic shock due to food, subsequent encounter   2. Perennial allergic rhinitis   3. Allergic conjunctivitis of both eyes     Meds ordered this encounter  Medications   EPINEPHrine 0.3 mg/0.3 mL IJ SOAJ injection    Sig: Inject 0.3 mg into the muscle as needed for anaphylaxis.    Dispense:  2 each    Refill:  1    Patient Instructions  Allergic rhinitis Continue allergen avoidance measures directed toward dust mite and cockroach as listed below Continue an over-the-counter antihistamine once a day  as needed for runny nose or itch Continue Flonase 2 sprays in each nostril once a day as needed for stuffy nose Consider saline nasal rinses as needed for nasal symptoms. Use this before any medicated nasal sprays for best result Consider allergen immunotherapy if your symptoms are not well controlled with the treatment plan as listed above  Food allergy Your skin testing was negative to shellfish at today's visit. Continue to avoid shellfish.  In case of an allergic reaction, take Benadryl 50 mg every 4 hours, and if life-threatening symptoms occur, inject with EpiPen 0.3 mg. A lab order has been placed to help [place] evaluate your food allergies. We will call you when the results become available. If the blood tests are negative you can move forward with a food challenge in the clinic.   Call the clinic if this treatment plan is not working well for you  Follow up in 1 year or sooner if needed.   Return in about 1 year (around 11/20/2022), or if symptoms worsen or fail to improve.    Thank you for the opportunity to care for this patient.  Please do not hesitate to contact me with questions.  Thermon Leyland, FNP [facility]

## 2021-11-19 NOTE — Patient Instructions (Signed)
Allergic rhinitis Continue allergen avoidance measures directed toward dust mite and cockroach as listed below Continue an over-the-counter antihistamine once a day as needed for runny nose or itch Continue Flonase 2 sprays in each nostril once a day as needed for stuffy nose Consider saline nasal rinses as needed for nasal symptoms. Use this before any medicated nasal sprays for best result Consider allergen immunotherapy if your symptoms are not well controlled with the treatment plan as listed above  Food allergy Your skin testing was negative to shellfish at today's visit. Continue to avoid shellfish.  In case of an allergic reaction, take Benadryl 50 mg every 4 hours, and if life-threatening symptoms occur, inject with EpiPen 0.3 mg. A lab order has been placed to help [place] evaluate your food allergies. We will call you when the results become available. If the blood tests are negative you can move forward with a food challenge in the clinic.   Call the clinic if this treatment plan is not working well for you  Follow up in 1 year or sooner if needed.   Control of Dust Mite Allergen Dust mites play a major role in allergic asthma and rhinitis. They occur in environments with high humidity wherever human skin is found. Dust mites absorb humidity from the atmosphere (ie, they do not drink) and feed on organic matter (including shed human and animal skin). Dust mites are a microscopic type of insect that you cannot see with the naked eye. High levels of dust mites have been detected from mattresses, pillows, carpets, upholstered furniture, bed covers, clothes, soft toys and any woven material. The principal allergen of the dust mite is found in its feces. A gram of dust may contain 1,000 mites and 250,000 fecal particles. Mite antigen is easily measured in the air during house cleaning activities. Dust mites do not bite and do not cause harm to humans, other than by triggering  allergies/asthma.  Ways to decrease your exposure to dust mites in your home:  1. Encase mattresses, box springs and pillows with a mite-impermeable barrier or cover  2. Wash sheets, blankets and drapes weekly in hot water (130 F) with detergent and dry them in a dryer on the hot setting.  3. Have the room cleaned frequently with a vacuum cleaner and a damp dust-mop. For carpeting or rugs, vacuuming with a vacuum cleaner equipped with a high-efficiency particulate air (HEPA) filter. The dust mite allergic individual should not be in a room which is being cleaned and should wait 1 hour after cleaning before going into the room.  4. Do not sleep on upholstered furniture (eg, couches).  5. If possible removing carpeting, upholstered furniture and drapery from the home is ideal. Horizontal blinds should be eliminated in the rooms where the person spends the most time (bedroom, study, television room). Washable vinyl, roller-type shades are optimal.  6. Remove all non-washable stuffed toys from the bedroom. Wash stuffed toys weekly like sheets and blankets above.  7. Reduce indoor humidity to less than 50%. Inexpensive humidity monitors can be purchased at most hardware stores. Do not use a humidifier as can make the problem worse and are not recommended.  Control of Cockroach Allergen Cockroach allergen has been identified as an important cause of acute attacks of asthma, especially in urban settings.  There are fifty-five species of cockroach that exist in the [place], however only three, the [place], [place] species produce allergen that can affect patients with Asthma.  Allergens can  be obtained from fecal particles, egg casings and secretions from cockroaches.    Remove food sources. Reduce access to water. Seal access and entry points. Spray runways with 0.5-1% Diazinon or Chlorpyrifos Blow boric acid power under stoves and refrigerator. Place bait stations  (hydramethylnon) at feeding sites.

## 2022-06-20 IMAGING — DX DG CHEST 1V
2 series · 2 of 2 positions shown · non-contrast
Comparison: 01/30/2018

CLINICAL DATA: Physical exam.

EXAM:
CHEST  1 VIEW

[chest pa (1 of 2)]
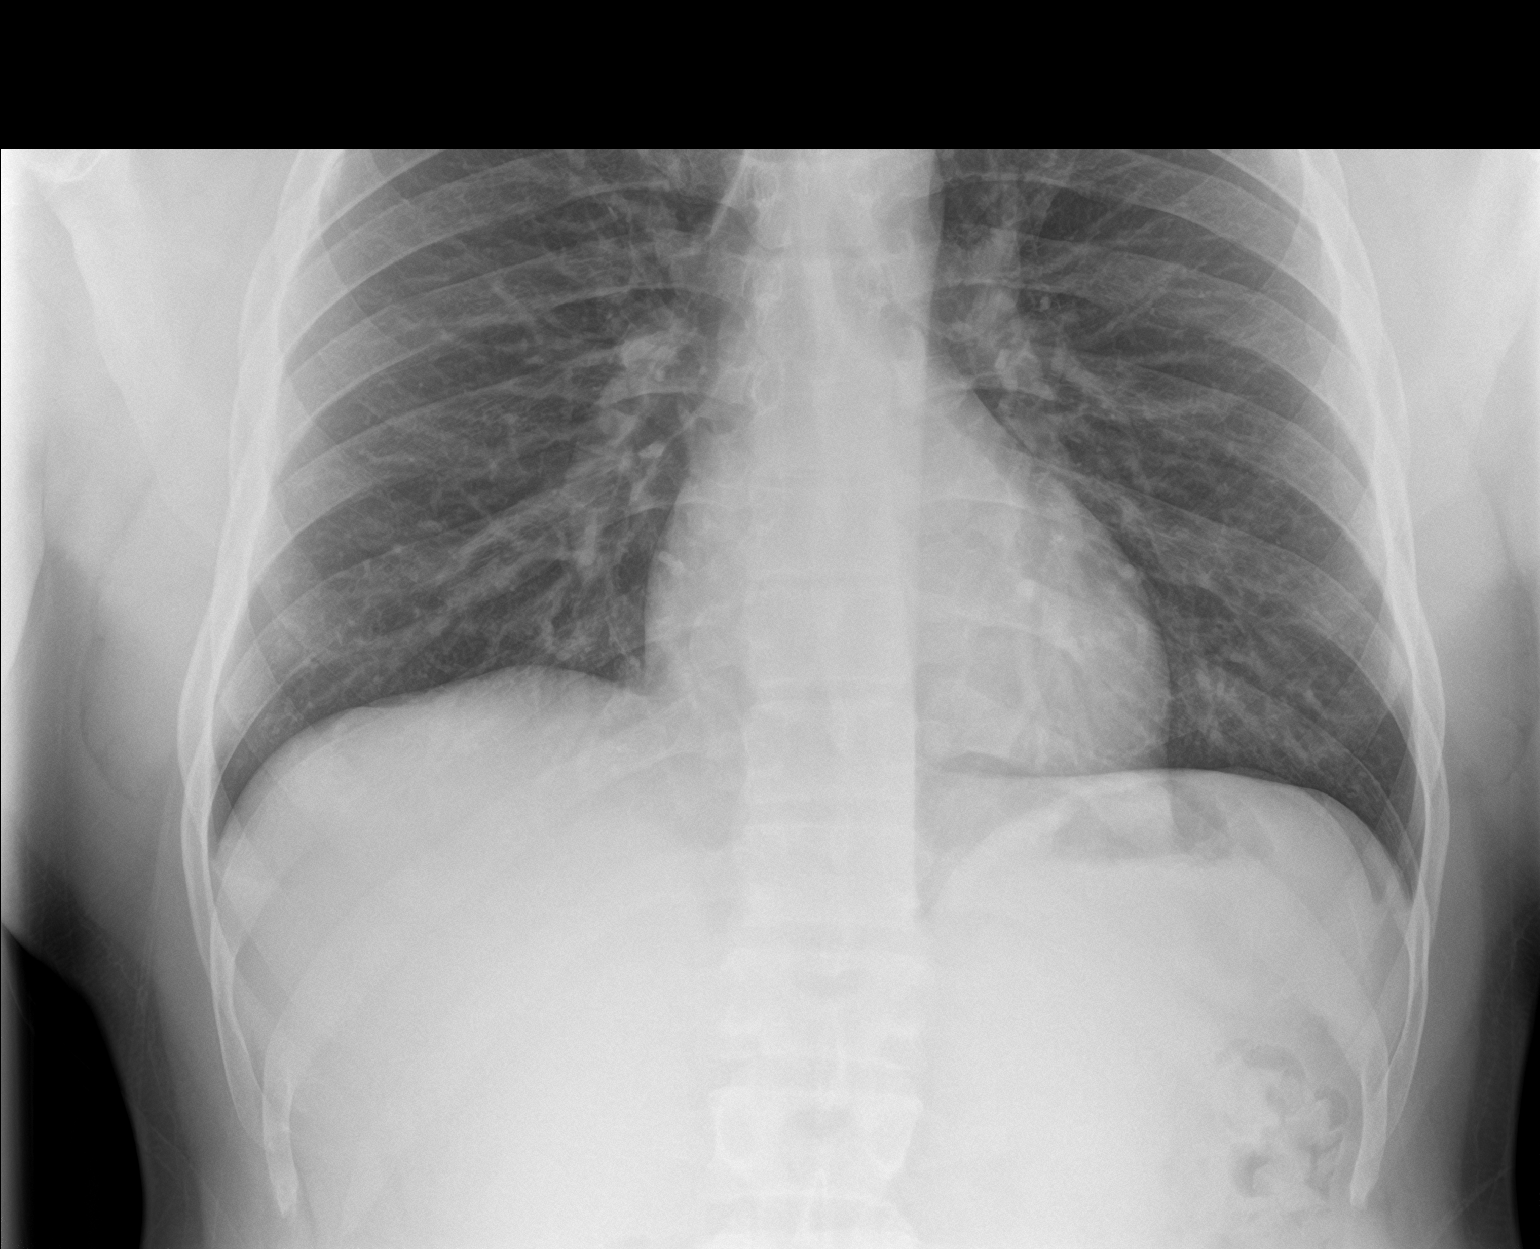

[chest pa (2 of 2)]
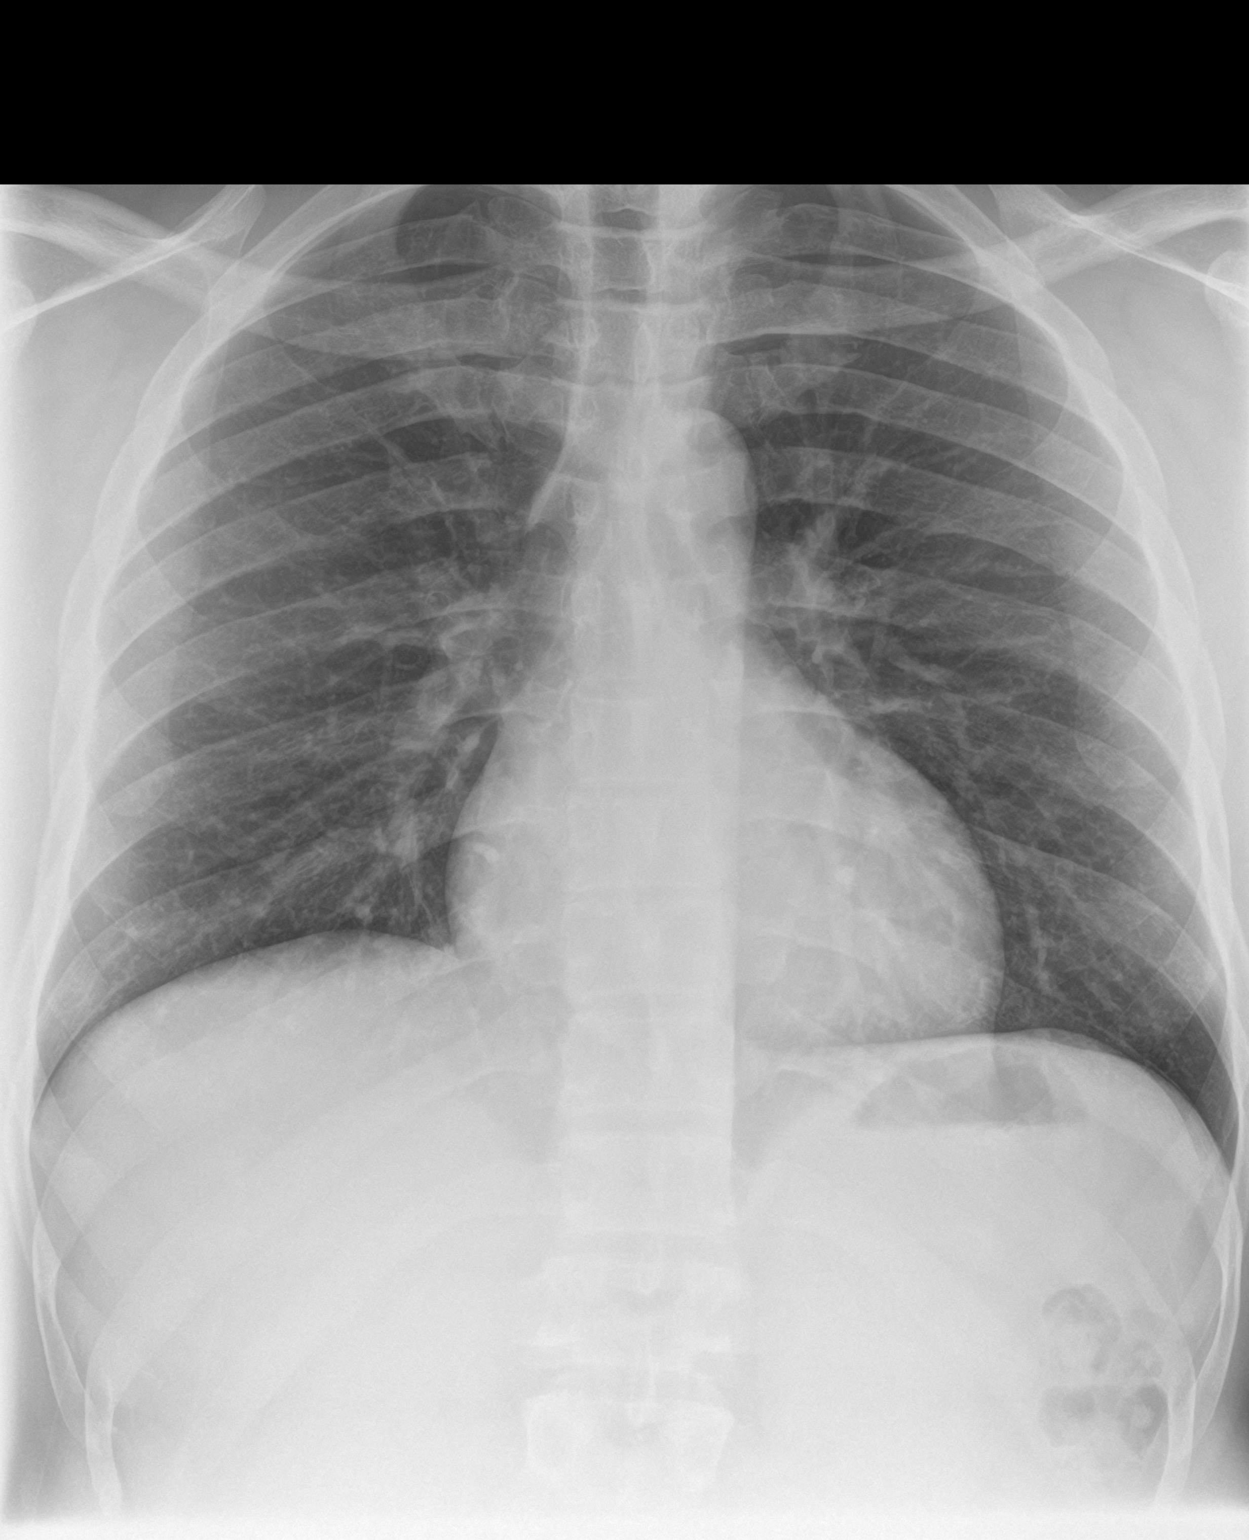

[2 of 2 positions shown; findings below may reference images not displayed]

FINDINGS: The heart size and mediastinal contours are within normal limits.
Both lungs are clear. The visualized skeletal structures are
unremarkable.
IMPRESSION: No active disease.

## 2022-10-25 ENCOUNTER — Telehealth: Payer: Self-pay | Admitting: Family Medicine

## 2022-10-25 NOTE — Telephone Encounter (Signed)
Patient's current authorization, [id number] expires on 11-19-2022. Patient has not been seen since Sept 2023 and does not have a follow up scheduled.  Called to inquire if patient would like a renwal of services and left message for patient to call back to discuss.

## 2023-10-11 ENCOUNTER — Ambulatory Visit (INDEPENDENT_AMBULATORY_CARE_PROVIDER_SITE_OTHER): Admitting: Physician Assistant

## 2023-10-11 ENCOUNTER — Encounter: Payer: Self-pay | Admitting: Physician Assistant

## 2023-10-11 VITALS — BP 127/78 | HR 59 | Temp 97.9°F | Ht 77.0 in | Wt 253.0 lb

## 2023-10-11 DIAGNOSIS — R7303 Prediabetes: Secondary | ICD-10-CM

## 2023-10-11 DIAGNOSIS — Z0001 Encounter for general adult medical examination with abnormal findings: Secondary | ICD-10-CM | POA: Diagnosis not present

## 2023-10-11 DIAGNOSIS — Z7689 Persons encountering health services in other specified circumstances: Secondary | ICD-10-CM

## 2023-10-11 DIAGNOSIS — Z0000 Encounter for general adult medical examination without abnormal findings: Secondary | ICD-10-CM

## 2023-10-11 DIAGNOSIS — Z13220 Encounter for screening for lipoid disorders: Secondary | ICD-10-CM

## 2023-10-11 NOTE — Progress Notes (Signed)
 Complete physical exam  Patient: Daryl Decker   DOB: May 03, 1985   38 y.o. Male  MRN: [id number]  Subjective:    Chief Complaint  Patient presents with   New Patient (Initial Visit)    Hx of diabetes, cholesterol and liver - has lost weight and changed diet Would like to recheck levels    Daryl Decker is a 38 y.o. male who presents today for a complete physical exam. He reports consuming a general diet, reports he is following the mediterranean diet. Patient participates in exercise at least 2 times weekly, usually playing basketball. He generally feels fairly well. He reports sleeping poorly. He does not have additional problems to discuss today.    Most recent fall risk assessment:    10/11/2023    9:47 AM  Fall Risk   Falls in the past year? 0  Number falls in past yr: 0  Injury with Fall? 0  Risk for fall due to : No Fall Risks  Follow up Falls evaluation completed     Most recent depression screenings:    10/11/2023    9:46 AM  PHQ 2/9 Scores  PHQ - 2 Score 4  PHQ- 9 Score 18    Vision:Not within last year  and Dental: No current dental problems and Receives regular dental care  Patient Care Team: Alka Falwell, Daryl Decker as PCP - General (Physician Assistant)   Outpatient Medications Prior to Visit  Medication Sig   cetirizine (ZYRTEC) 10 MG tablet Take 10 mg by mouth daily as needed for allergies.   EPINEPHrine  0.3 mg/0.3 mL IJ SOAJ injection Inject 0.3 mg into the muscle as needed for anaphylaxis.   Olopatadine  HCl 0.2 % SOLN Place 1 drop in each eye once a day as needed for itchy watery eyes (Patient not taking: Reported on 11/19/2021)   SUMAtriptan (IMITREX) 100 MG tablet Take 100 mg by mouth as directed.   No facility-administered medications prior to visit.    Review of Systems  Constitutional:  Negative for chills, fever and malaise/fatigue.  Eyes:  Negative for blurred vision and double vision.  Respiratory:  Negative for cough and shortness of breath.    Cardiovascular:  Negative for chest pain and palpitations.  Musculoskeletal:  Negative for joint pain and myalgias.  Neurological:  Positive for headaches. Negative for dizziness.  Psychiatric/Behavioral:  Negative for depression. The patient is nervous/anxious.           Objective:     BP 127/78   Pulse (!) 59   Temp 97.9 F (36.6 C)   Ht 6' 5 (1.956 m)   Wt 253 lb (114.8 kg)   SpO2 99%   BMI 30.00 kg/m   Physical Exam Constitutional:      Appearance: Normal appearance.  HENT:     Head: Normocephalic.     Mouth/Throat:     Mouth: Mucous membranes are moist.     Pharynx: Oropharynx is clear.  Eyes:     Extraocular Movements: Extraocular movements intact.     Conjunctiva/sclera: Conjunctivae normal.  Cardiovascular:     Rate and Rhythm: Normal rate and regular rhythm.     Heart sounds: Normal heart sounds. No murmur heard. Pulmonary:     Effort: Pulmonary effort is normal.     Breath sounds: Normal breath sounds. No wheezing or rales.  Skin:    General: Skin is warm and dry.  Neurological:     General: No focal deficit present.     Mental Status: He  is alert and oriented to person, place, and time.  Psychiatric:        Mood and Affect: Mood normal.        Behavior: Behavior normal.      No results found for any visits on 10/11/23.    Assessment & Plan:    Routine Health Maintenance and Physical Exam  Health Maintenance  Topic Date Due   HIV Screening  Never done   Hepatitis C Screening  Never done   DTaP/Tdap/Td vaccine (1 - Tdap) Never done   Hepatitis B Vaccine (1 of 3 - 19+ 3-dose series) Never done   HPV Vaccine (1 - 3-dose SCDM series) Never done   COVID-19 Vaccine (1 - 2024-25 season) Never done   Flu Shot  10/20/2023   Meningitis B Vaccine  Aged Out    Discussed health benefits of physical activity, and encouraged him to engage in regular exercise appropriate for his age and condition.  Problem List Items Addressed This Visit    None Visit Diagnoses       Encounter to establish care    -  Primary     Annual visit for general adult medical examination without abnormal findings       Relevant Orders   CMP14+EGFR   CBC with Differential/Platelet     Screening for lipid disorders       Relevant Orders   Lipid panel     Prediabetes       Relevant Orders   CMP14+EGFR   Hemoglobin A1c      Return in about 1 year (around 10/10/2024).  Adult wellness-complete.wellness physical was conducted today. Importance of diet and exercise were discussed in detail.  Importance of stress reduction and healthy living were discussed.  In addition to this a discussion regarding safety was also covered.  We also reviewed over immunizations and gave recommendations regarding current immunization needed for age.   In addition to this additional areas were also touched on including: healthy diet and exercise, weight loss, and management/supportive care for knee pain Preventative health exams needed: none, up to date   Colonoscopy not indicated  Patient was advised yearly wellness exam     Daryl Sarthak Rubenstein, PA-C

## 2023-10-12 ENCOUNTER — Ambulatory Visit: Payer: Self-pay | Admitting: Physician Assistant

## 2023-10-12 LAB — CMP14+EGFR
ALT: 52 IU/L — ABNORMAL HIGH (ref 0–44)
AST: 49 IU/L — ABNORMAL HIGH (ref 0–40)
Albumin: 4.7 g/dL (ref 4.1–5.1)
Alkaline Phosphatase: 98 IU/L (ref 44–121)
BUN/Creatinine Ratio: 17 (ref 9–20)
BUN: 21 mg/dL — ABNORMAL HIGH (ref 6–20)
Bilirubin Total: 0.5 mg/dL (ref 0.0–1.2)
CO2: 20 mmol/L (ref 20–29)
Calcium: 9.7 mg/dL (ref 8.7–10.2)
Chloride: 102 mmol/L (ref 96–106)
Creatinine, Ser: 1.23 mg/dL (ref 0.76–1.27)
Globulin, Total: 3.1 g/dL (ref 1.5–4.5)
Glucose: 90 mg/dL (ref 70–99)
Potassium: 4.5 mmol/L (ref 3.5–5.2)
Sodium: 138 mmol/L (ref 134–144)
Total Protein: 7.8 g/dL (ref 6.0–8.5)
eGFR: 78 mL/min/1.73 (ref >59)

## 2023-10-12 LAB — CBC WITH DIFFERENTIAL/PLATELET
Basophils Absolute: 0 x10E3/uL (ref 0.0–0.2)
Basos: 1 %
EOS (ABSOLUTE): 0.1 x10E3/uL (ref 0.0–0.4)
Eos: 1 %
Hematocrit: 46.8 % (ref 37.5–51.0)
Hemoglobin: 14.6 g/dL (ref 13.0–17.7)
Immature Grans (Abs): 0 x10E3/uL (ref 0.0–0.1)
Immature Granulocytes: 0 %
Lymphocytes Absolute: 2.8 x10E3/uL (ref 0.7–3.1)
Lymphs: 35 %
MCH: 27.8 pg (ref 26.6–33.0)
MCHC: 31.2 g/dL — ABNORMAL LOW (ref 31.5–35.7)
MCV: 89 fL (ref 79–97)
Monocytes Absolute: 0.5 x10E3/uL (ref 0.1–0.9)
Monocytes: 6 %
Neutrophils Absolute: 4.7 x10E3/uL (ref 1.4–7.0)
Neutrophils: 57 %
Platelets: 218 x10E3/uL (ref 150–450)
RBC: 5.26 x10E6/uL (ref 4.14–5.80)
RDW: 13.3 % (ref 11.6–15.4)
WBC: 8.1 x10E3/uL (ref 3.4–10.8)

## 2023-10-12 LAB — LIPID PANEL
Chol/HDL Ratio: 3.3 ratio (ref 0.0–5.0)
Cholesterol, Total: 189 mg/dL (ref 100–199)
HDL: 58 mg/dL (ref >39)
LDL Chol Calc (NIH): 120 mg/dL — ABNORMAL HIGH (ref 0–99)
Triglycerides: 60 mg/dL (ref 0–149)
VLDL Cholesterol Cal: 11 mg/dL (ref 5–40)

## 2023-10-12 LAB — HEMOGLOBIN A1C
Est. average glucose Bld gHb Est-mCnc: 131 mg/dL
Hgb A1c MFr Bld: 6.2 % — ABNORMAL HIGH (ref 4.8–5.6)

## 2024-01-08 ENCOUNTER — Encounter

## 2024-02-26 ENCOUNTER — Ambulatory Visit: Admitting: Physician Assistant

## 2024-03-04 ENCOUNTER — Ambulatory Visit: Admitting: Physician Assistant
